# Patient Record
Sex: Female | Born: 2000 | Race: White | Hispanic: No | Marital: Single | Smoking: Never smoker
Health system: Southern US, Community
[De-identification: ages and names within clinical notes are randomized; demographics above are authoritative.]

## PROBLEM LIST (undated history)

## (undated) DIAGNOSIS — F32A Depression, unspecified: Secondary | ICD-10-CM

## (undated) DIAGNOSIS — F419 Anxiety disorder, unspecified: Secondary | ICD-10-CM

## (undated) HISTORY — PX: TONSILLECTOMY: SUR1361

## (undated) HISTORY — DX: Depression, unspecified: F32.A

## (undated) HISTORY — DX: Anxiety disorder, unspecified: F41.9

---
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
## 2021-05-17 ENCOUNTER — Other Ambulatory Visit: Payer: Self-pay

## 2021-05-17 DIAGNOSIS — W01198A Fall on same level from slipping, tripping and stumbling with subsequent striking against other object, initial encounter: Secondary | ICD-10-CM | POA: Diagnosis not present

## 2021-05-17 DIAGNOSIS — S0990XA Unspecified injury of head, initial encounter: Secondary | ICD-10-CM | POA: Diagnosis present

## 2021-05-17 DIAGNOSIS — S0181XA Laceration without foreign body of other part of head, initial encounter: Secondary | ICD-10-CM | POA: Diagnosis not present

## 2021-05-17 DIAGNOSIS — R55 Syncope and collapse: Secondary | ICD-10-CM | POA: Insufficient documentation

## 2021-05-17 LAB — CBC
HCT: 40.9 % (ref 36.0–46.0)
Hemoglobin: 13.6 g/dL (ref 12.0–15.0)
MCH: 28.3 pg (ref 26.0–34.0)
MCHC: 33.3 g/dL (ref 30.0–36.0)
MCV: 85 fL (ref 80.0–100.0)
Platelets: 220 10*3/uL (ref 150–400)
RBC: 4.81 MIL/uL (ref 3.87–5.11)
RDW: 12.3 % (ref 11.5–15.5)
WBC: 7.3 10*3/uL (ref 4.0–10.5)
nRBC: 0 % (ref 0.0–0.2)

## 2021-05-17 NOTE — ED Triage Notes (Signed)
Pt presents to ER via ems from school.  Pt states the last thing she can remember, she was in her bed, and her friends reportedly heard a crash and found pt lying in floor.  Pt states when she came around, she was sitting in a chair with ems crew and her friends around her.  Pt has laceration noted underneath chin.  Lac is appx 1.5 cm with bleeding controlled.  Pt denies hx of blacking out in past.  Pt currently A&O x4 in NAD.   ?

## 2021-05-18 ENCOUNTER — Emergency Department: Payer: BC Managed Care – PPO

## 2021-05-18 ENCOUNTER — Emergency Department
Admission: EM | Admit: 2021-05-18 | Discharge: 2021-05-18 | Disposition: A | Discharge status: Home / Self Care | Payer: BC Managed Care – PPO | Attending: Emergency Medicine | Admitting: Emergency Medicine

## 2021-05-18 ENCOUNTER — Ambulatory Visit: Payer: Self-pay | Admitting: *Deleted

## 2021-05-18 DIAGNOSIS — R55 Syncope and collapse: Secondary | ICD-10-CM

## 2021-05-18 DIAGNOSIS — S0181XA Laceration without foreign body of other part of head, initial encounter: Secondary | ICD-10-CM

## 2021-05-18 DIAGNOSIS — W19XXXA Unspecified fall, initial encounter: Secondary | ICD-10-CM

## 2021-05-18 LAB — BASIC METABOLIC PANEL
Anion gap: 8 (ref 5–15)
BUN: 14 mg/dL (ref 6–20)
CO2: 25 mmol/L (ref 22–32)
Calcium: 9.3 mg/dL (ref 8.9–10.3)
Chloride: 104 mmol/L (ref 98–111)
Creatinine, Ser: 0.6 mg/dL (ref 0.44–1.00)
GFR, Estimated: >60 mL/min (ref >60)
Glucose, Bld: 112 mg/dL — ABNORMAL HIGH (ref 70–99)
Potassium: 4.2 mmol/L (ref 3.5–5.1)
Sodium: 137 mmol/L (ref 135–145)

## 2021-05-18 LAB — URINALYSIS, ROUTINE W REFLEX MICROSCOPIC
Bilirubin Urine: NEGATIVE
Glucose, UA: NEGATIVE mg/dL
Hgb urine dipstick: NEGATIVE
Ketones, ur: NEGATIVE mg/dL
Nitrite: NEGATIVE
Protein, ur: NEGATIVE mg/dL
Specific Gravity, Urine: 1.004 — ABNORMAL LOW (ref 1.005–1.030)
pH: 6 (ref 5.0–8.0)

## 2021-05-18 MED ORDER — BACITRACIN ZINC 500 UNIT/GM EX OINT
TOPICAL_OINTMENT | Freq: Once | CUTANEOUS | Status: AC
  Filled 2021-05-18: qty 0.9

## 2021-05-18 MED ORDER — LIDOCAINE HCL (PF) 1 % IJ SOLN
5.0000 mL | Freq: Once | INTRAMUSCULAR | Status: AC
  Administered 2021-05-18: 03:00:00 5 mL
  Filled 2021-05-18: qty 5

## 2021-05-18 NOTE — ED Provider Notes (Signed)
? ?[facility] ?Provider Note ? ? ? Event Date/Time  ? First MD Initiated Contact with Patient 05/18/21 0111   ?  (approximate) ? ? ?History  ? ?Fall and Loss of Consciousness ? ? ?HPI ? ?Ruth Smith is a 21 y.o. female who presents to the ED for evaluation of Fall and Loss of Consciousness ?  ?No relevant medical history.  No OCPs or estrogen use. ? ?Patient presents to the ED after a syncopal episode today, causing a chin laceration.  She reports that she was seated on the couch for couple hours doing work on her laptop, when she got up to let someone in the back door when she suddenly collapsed to the ground and struck her chin.  She reports of syncope with minimal prodrome.  Reports awakening with a headache, which is slowly improving.  Denies chest pain, shortness of breath or any further syncopal episodes.  Denies pain to the extremities, chest, back or abdomen. ? ?She is a Press photographer, studying psychology and criminal justice.  She is here with her boyfriend. ? ?Physical Exam  ? ?Triage Vital Signs: ?ED Triage Vitals  ?Enc Vitals Group  ?   BP 05/17/21 2334 (!) 133/91  ?   Pulse Rate 05/17/21 2334 83  ?   Resp 05/17/21 2334 17  ?   Temp 05/17/21 2334 98.6 ?F (37 ?C)  ?   Temp Source 05/17/21 2334 Oral  ?   SpO2 05/17/21 2334 100 %  ?   Weight 05/17/21 2330 102 lb (46.3 kg)  ?   Height 05/17/21 2330 5\' 4"  (1.626 m)  ?   Head Circumference --   ?   Peak Flow --   ?   Pain Score 05/17/21 2329 6  ?   Pain Loc --   ?   Pain Edu? --   ?   Excl. in [place]? --   ? ? ?Most recent vital signs: ?Vitals:  ? 05/17/21 2334 05/18/21 0156  ?BP: (!) 133/91 124/83  ?Pulse: 83 85  ?Resp: 17 16  ?Temp: 98.6 ?F (37 ?C)   ?SpO2: 100% 100%  ? ? ?General: Awake, no distress.  Pleasant and conversational in full sentences.  Ambulatory with normal gait. ?CV:  Good peripheral perfusion. RRR ?Resp:  Normal effort.  ?Abd:  No distention.  Soft and benign ?MSK:  No deformity noted.  ?Neuro:  No focal deficits  appreciated. Cranial nerves II through XII intact ?5/5 strength and sensation in all 4 extremities ?Other:  Isolated horizontal laceration to the chin at the midline, about 2 cm in length.  Into the subcutaneous tissue.  Hemostatic with direct pressure.  No associated bony step-offs, no intraoral trauma. ? ? ?ED Results / Procedures / Treatments  ? ?Labs ?(all labs ordered are listed, but only abnormal results are displayed) ?Labs Reviewed  ?BASIC METABOLIC PANEL - Abnormal; Notable for the following components:  ?    Result Value  ? Glucose, Bld 112 (*)   ? All other components within normal limits  ?URINALYSIS, ROUTINE W REFLEX MICROSCOPIC - Abnormal; Notable for the following components:  ? Color, Urine STRAW (*)   ? APPearance CLEAR (*)   ? Specific Gravity, Urine 1.004 (*)   ? Leukocytes,Ua TRACE (*)   ? Bacteria, UA RARE (*)   ? All other components within normal limits  ?CBC  ?CBG MONITORING, ED  ?POC URINE PREG, ED  ? ? ?EKG ?Sinus rhythm, rate of 90 bpm.  Normal axis and intervals.  No evidence of acute ischemia. ? ?RADIOLOGY ?CT of the head reviewed by me without evidence of acute intracranial pathology.  No mandibular fractures noted. ? ?Official radiology report(s): ?CT HEAD WO CONTRAST (5MM) ? ?Result Date: 05/18/2021 ?CLINICAL DATA:  Recent fall with headaches, initial encounter EXAM: CT HEAD WITHOUT CONTRAST TECHNIQUE: Contiguous axial images were obtained from the base of the skull through the vertex without intravenous contrast. RADIATION DOSE REDUCTION: This exam was performed according to the departmental dose-optimization program which includes automated exposure control, adjustment of the mA and/or kV according to patient size and/or use of iterative reconstruction technique. COMPARISON:  None. FINDINGS: Brain: No evidence of acute infarction, hemorrhage, hydrocephalus, extra-axial collection or mass lesion/mass effect. Vascular: No hyperdense vessel or unexpected calcification. Skull: Normal.  Negative for fracture or focal lesion. Sinuses/Orbits: No acute finding. Other: None. IMPRESSION: No acute intracranial abnormality noted. Electronically Signed   By: Inez Catalina M.D.   On: 05/18/2021 02:59   ? ?PROCEDURES and INTERVENTIONS: ? ?Marland Kitchen.Laceration Repair ? ?Date/Time: 05/18/2021 3:31 AM ?Performed by: Vladimir Crofts, MD ?Authorized by: Vladimir Crofts, MD  ? ?Consent:  ?  Consent obtained:  Verbal ?  Consent given by:  Patient ?  Risks, benefits, and alternatives were discussed: yes   ?Anesthesia:  ?  Anesthesia method:  Local infiltration ?  Local anesthetic:  Lidocaine 1% w/o epi ?Laceration details:  ?  Location:  Face ?  Face location:  Chin ?  Length (cm):  2 ?Pre-procedure details:  ?  Preparation:  Patient was prepped and draped in usual sterile fashion ?Exploration:  ?  Hemostasis achieved with:  Direct pressure ?  Imaging obtained comment:  Ct ?  Imaging outcome: foreign body not noted   ?Treatment:  ?  Area cleansed with:  Povidone-iodine ?  Amount of cleaning:  Standard ?  Irrigation solution:  Sterile water ?  Irrigation method:  Syringe ?  Visualized foreign bodies/material removed: no   ?  Debridement:  None ?  Undermining:  None ?  Scar revision: no   ?Skin repair:  ?  Repair method:  Sutures ?  Suture size:  5-0 ?  Wound skin closure material used: monocryl. ?  Suture technique:  Simple interrupted ?  Number of sutures:  3 ?Approximation:  ?  Approximation:  Close ?Repair type:  ?  Repair type:  Simple ?Post-procedure details:  ?  Dressing:  Antibiotic ointment ?  Procedure completion:  Tolerated well, no immediate complications ? ?Medications  ?bacitracin ointment (has no administration in time range)  ?lidocaine (PF) (XYLOCAINE) 1 % injection 5 mL (5 mLs Infiltration Given 05/18/21 0324)  ? ? ? ?IMPRESSION / MDM / ASSESSMENT AND PLAN / ED COURSE  ?I reviewed the triage vital signs and the nursing notes. ? ?Healthy and low risk 21 year old girl presents to the ED with a chin laceration after a  syncopal episode, ultimately suitable for outpatient management.  She presents with normal vital signs and reassuring examination.  No signs of neurologic or vascular deficits.  No distress.  Does have an isolated laceration to the chin, that was repaired as above.  Blood work is benign with a normal metabolic panel and CBC, urine without infectious features.  EKG is normal without ischemic features or interval changes to suggest cardiac etiology of syncope.  CT scan of the head without evidence of ICH or mandibular fracture.  Uncertain etiology of her syncopal episode, possibly vasovagal in setting of standing up rapidly  after being seated for a while.  Less likely cardiogenic.  PERC negative and less likely PE.  I considered observation admission for further telemetry monitoring for this patient, but shared decision making yields plan to pursue outpatient management.  We discussed close return precautions and she is suitable for outpatient management. ? ?Clinical Course as of 05/18/21 0332  ?Thu May 18, 2021  ?0327 3 sutures placed, well-tolerated. [DS]  ?  ?Clinical Course User Index ?[DS] Vladimir Crofts, MD  ? ? ? ?FINAL CLINICAL IMPRESSION(S) / ED DIAGNOSES  ? ?Final diagnoses:  ?Fall, initial encounter  ?Syncope and collapse  ?Chin laceration, initial encounter  ? ? ? ?Rx / DC Orders  ? ?ED Discharge Orders   ? ? None  ? ?  ? ? ? ?Note:  This document was prepared using Dragon voice recognition software and may include unintentional dictation errors. ?  ?Vladimir Crofts, MD ?05/18/21 [phone] ? ?

## 2021-05-18 NOTE — Discharge Instructions (Signed)
Please take Tylenol and ibuprofen/Advil for your pain.  It is safe to take them together, or to alternate them every few hours.  Take up to 1000mg  of Tylenol at a time, up to 4 times per day.  Do not take more than 4000 mg of Tylenol in 24 hours.  For ibuprofen, take 400-600 mg, 4-5 times per day. ? ?The 3 stitches should absorb on their own in the next 7-10 days.  ? ?Keep the area clean and dry, then apply some sort of ointment like Neosporin or even just Vaseline.  This will help with healing, preventing infection and reducing scar tissue. ? ?If you develop any fevers, pus coming from this wound or any additional episodes of just randomly passing out then please return to the ED. ?

## 2021-05-18 NOTE — Telephone Encounter (Addendum)
?  Chief Complaint: wound care ?Symptoms: none ?Frequency: na ?Pertinent Negatives: Patient denies problem with wound itself ?Disposition: [] ED /[] Urgent Care (no appt availability in office) / [] Appointment(In office/virtual)/ []  [facility]/ [] Home Care/ [] Refused Recommended Disposition /[] [facility]/ [x]  Follow-up with PCP ?Additional Notes: Pt states when leaving ED she got after visit summary that said keep area dry and apply neosporin on it. She states another doctor said to not do anything to it today, start washing with soap and water and applying neosporin tomorrow. Just wondering which to do. Advised that it is clean today, start washing, drying, and apply neosporin tomorrow. ? ?Reason for Disposition ? Health Information question, no triage required and triager able to answer question ? ?Answer Assessment - Initial Assessment Questions ?1. REASON FOR CALL or QUESTION: "What is your reason for calling today?" or "How can I best help you?" or "What question do you have that I can help answer?" ?Pt states two doctors in ED today gave her different instructions. Wondering what to do. ? ?Protocols used: Information Only Call - No Triage-A-AH ? ?

## 2021-06-14 DIAGNOSIS — F419 Anxiety disorder, unspecified: Secondary | ICD-10-CM | POA: Insufficient documentation

## 2021-06-14 DIAGNOSIS — M41129 Adolescent idiopathic scoliosis, site unspecified: Secondary | ICD-10-CM | POA: Insufficient documentation

## 2021-09-21 DIAGNOSIS — K5909 Other constipation: Secondary | ICD-10-CM | POA: Insufficient documentation

## 2021-10-27 ENCOUNTER — Ambulatory Visit (INDEPENDENT_AMBULATORY_CARE_PROVIDER_SITE_OTHER): Payer: BC Managed Care – PPO

## 2021-10-27 ENCOUNTER — Ambulatory Visit
Admission: EM | Admit: 2021-10-27 | Discharge: 2021-10-27 | Disposition: A | Discharge status: Home / Self Care | Payer: BC Managed Care – PPO | Attending: Family Medicine | Admitting: Family Medicine

## 2021-10-27 ENCOUNTER — Other Ambulatory Visit: Payer: BC Managed Care – PPO

## 2021-10-27 ENCOUNTER — Encounter: Payer: Self-pay | Admitting: Emergency Medicine

## 2021-10-27 DIAGNOSIS — S99912A Unspecified injury of left ankle, initial encounter: Secondary | ICD-10-CM

## 2021-10-27 DIAGNOSIS — M79672 Pain in left foot: Secondary | ICD-10-CM

## 2021-10-27 NOTE — ED Provider Notes (Signed)
Ruth Smith    CSN: [id number] Arrival date & time: 10/27/21  1737      History   Chief Complaint Chief Complaint  Patient presents with   Ankle Pain    Entered by patient    HPI Ruth Smith is a 21 y.o. female.   HPI Patient presents for evaluation of left ankle pain and left foot pain sustained following rolling of ankle and hitting lateral aspect of foot on left side of concrete barrier. Patient reports injury occurred less than 2 hours ago. She has not taken any medication or iced injury. She endorses pain with full weight bearing at the base of her ankle extending lateral aspect of foot. She denies impaired sensation, numbness or tingling of foot.  History reviewed. No pertinent past medical history.  There are no problems to display for this patient.   History reviewed. No pertinent surgical history.  OB History   No obstetric history on file.      Home Medications    Prior to Admission medications   Not on File    Family History History reviewed. No pertinent family history.  Social History Social History   Tobacco Use   Smoking status: Never   Smokeless tobacco: Never  Substance Use Topics   Alcohol use: Yes    Comment: occasional   Drug use: Never     Allergies   Patient has no known allergies.   Review of Systems Review of Systems Pertinent negatives listed in HPI   Physical Exam Triage Vital Signs ED Triage Vitals  Enc Vitals Group     BP      Pulse      Resp      Temp      Temp src      SpO2      Weight      Height      Head Circumference      Peak Flow      Pain Score      Pain Loc      Pain Edu?      Excl. in [place]?    No data found.  Updated Vital Signs BP 131/88 (BP Location: Right Arm)   Pulse 89   Temp 99 F (37.2 C) (Oral)   Resp 16   SpO2 96%   Visual Acuity Right Eye Distance:   Left Eye Distance:   Bilateral Distance:    Right Eye Near:   Left Eye Near:    Bilateral Near:     Physical  Exam Constitutional:      Appearance: Normal appearance.  Cardiovascular:     Rate and Rhythm: Normal rate and regular rhythm.  Pulmonary:     Effort: Pulmonary effort is normal.     Breath sounds: Normal breath sounds.  Musculoskeletal:     Right ankle: Normal.     Left ankle: Swelling present. Tenderness present over the lateral malleolus and base of 5th metatarsal. Decreased range of motion.     Left Achilles Tendon: Normal.  Skin:    General: Skin is warm and dry.  Neurological:     General: No focal deficit present.     Mental Status: She is alert.  Psychiatric:        Mood and Affect: Mood normal.        Behavior: Behavior normal.    UC Treatments / Results  Labs (all labs ordered are listed, but only abnormal results are displayed) Labs Reviewed - No  data to display  EKG   Radiology DG Foot Complete Left  Result Date: 10/27/2021 CLINICAL DATA:  Trauma, pain EXAM: LEFT FOOT - COMPLETE 3+ VIEW COMPARISON:  None Available. FINDINGS: There is no evidence of fracture or dislocation. There is no evidence of arthropathy or other focal bone abnormality. Soft tissues are unremarkable. IMPRESSION: No fracture or dislocation is seen. Electronically Signed   By: Ernie Avena M.D.   On: 10/27/2021 18:16   DG Ankle Complete Left  Result Date: 10/27/2021 CLINICAL DATA:  Twisted left ankle, pain EXAM: LEFT ANKLE COMPLETE - 3+ VIEW COMPARISON:  None Available. FINDINGS: There is no evidence of fracture, dislocation, or joint effusion. There is no evidence of arthropathy or other focal bone abnormality. Soft tissues are unremarkable. IMPRESSION: No fracture or dislocation of the left ankle. Electronically Signed   By: Jearld Lesch M.D.   On: 10/27/2021 18:15    Procedures Procedures (including critical care time)  Medications Ordered in UC Medications - No data to display  Initial Impression / Assessment and Plan / UC Course  I have reviewed the triage vital signs and the  nursing notes.  Pertinent labs & imaging results that were available during my care of the patient were reviewed by me and considered in my medical decision making (see chart for details).    Imaging of the Left foot and Left ankle negative. Recommend RICE. ACE wrap applied to midfoot and ankle (left). If symptoms worsen or have not significantly improved within  7 days, follow-up with [facility]. Final Clinical Impressions(s) / UC Diagnoses   Final diagnoses:  Left ankle injury, initial encounter  Pain of left foot     Discharge Instructions      Imaging negative for acute fracture ankle and or foot. Ibuprofen 600 mg 3 times daily for acute pain  Recommend, compressive ACE bandage. Rest and elevate the affected painful area.  Apply cold compresses intermittently as needed.  As pain recedes, begin normal activities slowly as tolerated.  If pain persists, follow-up at [facility]      ED Prescriptions   None    PDMP not reviewed this encounter.   Bing Neighbors, FNP 10/27/21 [phone]

## 2021-10-27 NOTE — Discharge Instructions (Addendum)
Imaging negative for acute fracture ankle and or foot. Ibuprofen 600 mg 3 times daily for acute pain  Recommend, compressive ACE bandage. Rest and elevate the affected painful area.  Apply cold compresses intermittently as needed.  As pain recedes, begin normal activities slowly as tolerated.  If pain persists, follow-up at [facility]

## 2021-10-27 NOTE — ED Triage Notes (Signed)
Stepped on parking bumper/stone object, rolled left ankle this afternoon. Experiencing pain to lateral aspect of base of left ankle/foot. Pain worsens with weight bearing, antalgic gait present. Mild swelling/bruising visible, tender to touch.

## 2021-12-08 ENCOUNTER — Ambulatory Visit
Admission: RE | Admit: 2021-12-08 | Discharge: 2021-12-08 | Disposition: A | Discharge status: Home / Self Care | Payer: BC Managed Care – PPO | Source: Ambulatory Visit

## 2021-12-08 VITALS — BP 116/82 | HR 74 | Temp 97.9°F | Resp 15

## 2021-12-08 DIAGNOSIS — J22 Unspecified acute lower respiratory infection: Secondary | ICD-10-CM | POA: Diagnosis not present

## 2021-12-08 DIAGNOSIS — R051 Acute cough: Secondary | ICD-10-CM | POA: Diagnosis not present

## 2021-12-08 MED ORDER — AMOXICILLIN-POT CLAVULANATE 875-125 MG PO TABS: 1.0000 | ORAL_TABLET | Freq: Two times a day (BID) | ORAL | 0 refills | Status: DC

## 2021-12-08 MED ORDER — PREDNISONE 10 MG PO TABS: ORAL_TABLET | ORAL | 0 refills | Status: DC

## 2021-12-08 NOTE — Discharge Instructions (Addendum)
Follow up here or with your primary care provider if your symptoms are not improving with treatment.     

## 2021-12-08 NOTE — ED Provider Notes (Signed)
Roderic Palau    CSN: [id number] Arrival date & time: 12/08/21  [id number]      History   Chief Complaint Chief Complaint  Patient presents with   Cough    Entered by patient    HPI Ruth Smith is a 21 y.o. female.    Cough   Presents to urgent care with complaint of cough since 1+ weeks. Reports associated symptoms of thoracic and lumbar back pain. Back pain is worse with cough. Also nasal congestion is present, previously with sinus pressure and ears popping, now resolved. Previously sore throat, now resolved.  Denies fever. Denies GI symptoms - no n/v/d.  Taking OTC medication without relief.  No past medical history on file.  There are no problems to display for this patient.   No past surgical history on file.  OB History   No obstetric history on file.      Home Medications    Prior to Admission medications   Not on File    Family History No family history on file.  Social History Social History   Tobacco Use   Smoking status: Never   Smokeless tobacco: Never  Substance Use Topics   Alcohol use: Yes    Comment: occasional   Drug use: Never     Allergies   Patient has no known allergies.   Review of Systems Review of Systems  Respiratory:  Positive for cough.      Physical Exam Triage Vital Signs ED Triage Vitals  Enc Vitals Group     BP      Pulse      Resp      Temp      Temp src      SpO2      Weight      Height      Head Circumference      Peak Flow      Pain Score      Pain Loc      Pain Edu?      Excl. in [place]?    No data found.  Updated Vital Signs There were no vitals taken for this visit.  Visual Acuity Right Eye Distance:   Left Eye Distance:   Bilateral Distance:    Right Eye Near:   Left Eye Near:    Bilateral Near:     Physical Exam Vitals reviewed.  Constitutional:      Appearance: Normal appearance.  HENT:     Head: Normocephalic and atraumatic.     Right Ear: Tympanic membrane  normal.     Left Ear: Tympanic membrane normal.     Nose: Congestion present.     Mouth/Throat:     Mouth: Mucous membranes are moist.     Pharynx: Oropharynx is clear. Posterior oropharyngeal erythema present. No oropharyngeal exudate.  Eyes:     Conjunctiva/sclera: Conjunctivae normal.     Pupils: Pupils are equal, round, and reactive to light.  Cardiovascular:     Rate and Rhythm: Normal rate.     Pulses: Normal pulses.     Heart sounds: Normal heart sounds.  Pulmonary:     Breath sounds: Normal breath sounds.  Musculoskeletal:     Cervical back: Normal range of motion and neck supple.  Skin:    General: Skin is warm and dry.  Neurological:     General: No focal deficit present.     Mental Status: She is alert and oriented to person, place, and time.  Psychiatric:  Mood and Affect: Mood normal.        Behavior: Behavior normal.      UC Treatments / Results  Labs (all labs ordered are listed, but only abnormal results are displayed) Labs Reviewed - No data to display  EKG   Radiology No results found.  Procedures Procedures (including critical care time)  Medications Ordered in UC Medications - No data to display  Initial Impression / Assessment and Plan / UC Course  I have reviewed the triage vital signs and the nursing notes.  Pertinent labs & imaging results that were available during my care of the patient were reviewed by me and considered in my medical decision making (see chart for details).   Some crackles in the upper right lobe.  Given this along with duration of symptoms, will treat for acute bacterial infection with Augmentin.  Prescribing a short taper of prednisone for cough.   Final Clinical Impressions(s) / UC Diagnoses   Final diagnoses:  None   Discharge Instructions   None    ED Prescriptions   None    PDMP not reviewed this encounter.   Rose Phi, [place] 12/08/21 [phone]

## 2021-12-08 NOTE — ED Triage Notes (Signed)
Pt. Is c/o a constant cough and back pain for the last week. Pt. Has been taking OTC medication w/ no relief.

## 2021-12-20 ENCOUNTER — Ambulatory Visit
Admission: EM | Admit: 2021-12-20 | Discharge: 2021-12-20 | Disposition: A | Discharge status: Home / Self Care | Payer: BC Managed Care – PPO | Attending: Emergency Medicine | Admitting: Emergency Medicine

## 2021-12-20 DIAGNOSIS — M546 Pain in thoracic spine: Secondary | ICD-10-CM | POA: Diagnosis not present

## 2021-12-20 MED ORDER — IBUPROFEN 600 MG PO TABS: 600.0000 mg | ORAL_TABLET | Freq: Four times a day (QID) | ORAL | 0 refills | Status: AC | PRN

## 2021-12-20 MED ORDER — CYCLOBENZAPRINE HCL 5 MG PO TABS: 5.0000 mg | ORAL_TABLET | Freq: Three times a day (TID) | ORAL | 0 refills | Status: DC | PRN

## 2021-12-20 NOTE — Discharge Instructions (Addendum)
Take the ibuprofen as needed for discomfort.  Take the muscle relaxer as needed for muscle spasm; Do not drive, operate machinery, or drink alcohol with this medication as it can cause drowsiness.   Follow up with your primary care provider or an orthopedist if your symptoms are not improving.     

## 2021-12-20 NOTE — ED Provider Notes (Signed)
Roderic Palau    CSN: [id number] Arrival date & time: 12/20/21  0809      History   Chief Complaint Chief Complaint  Patient presents with   Back Pain    HPI Ruth Smith is a 21 y.o. female.  Patient presents with left thoracic back pain x2 days.  No falls or injury.  The pain started when she was lying in bed, watching a movie on her computer.  She describes the pain as sharp and tingling.  It is nonradiating; worse with deep breathing and improves with sitting up straight.  No associated symptoms.  No fever, chills, cough, shortness of breath, abdominal pain, dysuria, hematuria, numbness, weakness, paresthesias, or other symptoms.  She has a history of scoliosis.  No OTC medications taken today.  She denies current pregnancy or breastfeeding.     The history is provided by the patient and medical records.    Past Medical History:  Diagnosis Date   Anxiety    Depression     Patient Active Problem List   Diagnosis Date Noted   Chronic constipation 09/21/2021   Adolescent idiopathic scoliosis, site unspecified 06/14/2021   Anxiety 06/14/2021    Past Surgical History:  Procedure Laterality Date   TONSILLECTOMY      OB History   No obstetric history on file.      Home Medications    Prior to Admission medications   Medication Sig Start Date End Date Taking? Authorizing Provider  cyclobenzaprine (FLEXERIL) 5 MG tablet Take 1 tablet (5 mg total) by mouth 3 (three) times daily as needed for muscle spasms. 12/20/21  Yes Sharion Balloon, NP  ibuprofen (ADVIL) 600 MG tablet Take 1 tablet (600 mg total) by mouth every 6 (six) hours as needed. 12/20/21  Yes Sharion Balloon, NP  amoxicillin-clavulanate (AUGMENTIN) 875-125 MG tablet Take 1 tablet by mouth every 12 (twelve) hours. 12/08/21   Immordino, Annie Main, FNP  LINZESS 145 MCG CAPS capsule Take 145 mcg by mouth daily. 11/24/21   [provider]  nitrofurantoin, macrocrystal-monohydrate, (MACROBID) 100 MG  capsule Take 100 mg by mouth 2 (two) times daily. 10/05/21   [provider]  ofloxacin (OCUFLOX) 0.3 % ophthalmic solution SMARTSIG:1-2 Drop(s) In Eye(s) 08/27/21   [provider]  predniSONE (DELTASONE) 10 MG tablet Take 4 tablets (40 mg) x 2 days, then 2 tablets (20 mg) x 1 day, then 1 tablet (10 mg) x 1 day. 12/08/21   Immordino, Annie Main, FNP  sertraline (ZOLOFT) 100 MG tablet  02/09/20   [provider]    Family History History reviewed. No pertinent family history.  Social History Social History   Tobacco Use   Smoking status: Never   Smokeless tobacco: Never  Substance Use Topics   Alcohol use: Yes    Comment: occasional   Drug use: Never     Allergies   Patient has no known allergies.   Review of Systems Review of Systems  Constitutional:  Negative for chills and fever.  Respiratory:  Negative for cough and shortness of breath.   Cardiovascular:  Negative for chest pain and palpitations.  Gastrointestinal:  Negative for abdominal pain and vomiting.  Genitourinary:  Negative for dysuria and hematuria.  Musculoskeletal:  Positive for back pain. Negative for gait problem.  Skin:  Negative for color change, rash and wound.  Neurological:  Negative for weakness and numbness.  All other systems reviewed and are negative.    Physical Exam Triage Vital Signs ED Triage  Vitals  Enc Vitals Group     BP      Pulse      Resp      Temp      Temp src      SpO2      Weight      Height      Head Circumference      Peak Flow      Pain Score      Pain Loc      Pain Edu?      Excl. in [place]?    No data found.  Updated Vital Signs BP 114/81   Pulse 72   Temp 98.1 F (36.7 C)   Resp 18   Ht 5\' 5"  (1.651 m)   Wt 102 lb (46.3 kg)   LMP 11/14/2021 (Exact Date)   SpO2 98%   BMI 16.97 kg/m   Visual Acuity Right Eye Distance:   Left Eye Distance:   Bilateral Distance:    Right Eye Near:   Left Eye Near:    Bilateral Near:      Physical Exam Vitals and nursing note reviewed.  Constitutional:      General: She is not in acute distress.    Appearance: Normal appearance. She is well-developed. She is not ill-appearing.  HENT:     Mouth/Throat:     Mouth: Mucous membranes are moist.  Cardiovascular:     Rate and Rhythm: Normal rate and regular rhythm.     Heart sounds: Normal heart sounds.  Pulmonary:     Effort: Pulmonary effort is normal. No respiratory distress.     Breath sounds: Normal breath sounds.  Abdominal:     General: Bowel sounds are normal.     Palpations: Abdomen is soft.     Tenderness: There is no abdominal tenderness. There is no right CVA tenderness, left CVA tenderness, guarding or rebound.  Musculoskeletal:        General: No swelling, tenderness, deformity or signs of injury. Normal range of motion.     Cervical back: Neck supple.  Skin:    General: Skin is warm and dry.     Capillary Refill: Capillary refill takes less than 2 seconds.     Findings: No bruising, erythema, lesion or rash.  Neurological:     General: No focal deficit present.     Mental Status: She is alert and oriented to person, place, and time.     Sensory: No sensory deficit.     Motor: No weakness.     Gait: Gait normal.  Psychiatric:        Mood and Affect: Mood normal.        Behavior: Behavior normal.      UC Treatments / Results  Labs (all labs ordered are listed, but only abnormal results are displayed) Labs Reviewed - No data to display  EKG   Radiology No results found.  Procedures Procedures (including critical care time)  Medications Ordered in UC Medications - No data to display  Initial Impression / Assessment and Plan / UC Course  I have reviewed the triage vital signs and the nursing notes.  Pertinent labs & imaging results that were available during my care of the patient were reviewed by me and considered in my medical decision making (see chart for details).    Acute left  thoracic back pain.  Patient declines urine check as she is not having any urine symptoms.  Treating today with ibuprofen and Flexeril.  Precautions for drowsiness with Flexeril discussed.  Instructed patient to follow up with her PCP or an orthopedist if her symptoms are not improving.  Contact information for on-call Ortho provided.  She agrees to plan of care.    Final Clinical Impressions(s) / UC Diagnoses   Final diagnoses:  Acute left-sided thoracic back pain     Discharge Instructions      Take the ibuprofen as needed for discomfort.  Take the muscle relaxer as needed for muscle spasm; Do not drive, operate machinery, or drink alcohol with this medication as it can cause drowsiness.   Follow up with your primary care provider or an orthopedist if your symptoms are not improving.         ED Prescriptions     Medication Sig Dispense Auth. Provider   ibuprofen (ADVIL) 600 MG tablet Take 1 tablet (600 mg total) by mouth every 6 (six) hours as needed. 30 tablet Mickie Bail, NP   cyclobenzaprine (FLEXERIL) 5 MG tablet Take 1 tablet (5 mg total) by mouth 3 (three) times daily as needed for muscle spasms. 15 tablet Mickie Bail, NP      PDMP not reviewed this encounter.   Mickie Bail, NP 12/20/21 [phone]

## 2021-12-20 NOTE — ED Triage Notes (Addendum)
Patient to Urgent Care with complaints of mid-thoracic back pain x2 days. Reports she first noticed the pain when she was laying down in bed. Describes pain as sharp and tingling, worsens with deep breathing and only present when lying down.   Reports nausea last night. Denies any known injury. Reports recently being treated for a lower respiratory infection- no longer symptomatic.

## 2022-02-12 ENCOUNTER — Ambulatory Visit (INDEPENDENT_AMBULATORY_CARE_PROVIDER_SITE_OTHER): Payer: BC Managed Care – PPO | Admitting: Oncology

## 2022-02-12 ENCOUNTER — Encounter: Payer: Self-pay | Admitting: Oncology

## 2022-02-12 VITALS — BP 118/60 | HR 69 | Temp 97.4°F | Resp 18 | Ht 65.5 in | Wt 111.0 lb

## 2022-02-12 DIAGNOSIS — R051 Acute cough: Secondary | ICD-10-CM

## 2022-02-12 MED ORDER — AZITHROMYCIN 250 MG PO TABS: ORAL_TABLET | ORAL | 0 refills | Status: AC

## 2022-02-12 MED ORDER — PREDNISONE 20 MG PO TABS: 40.0000 mg | ORAL_TABLET | Freq: Every day | ORAL | 0 refills | Status: AC

## 2022-02-12 NOTE — Progress Notes (Signed)
[facility] [address] [place], [place] [address] Phone: [phone] Fax: [phone]   Office Visit Note  Patient Name: Ruth Smith  Date of Birth:2000/08/13  Med Rec number [id number]  Date of Service: 02/12/2022  Patient has no known allergies.  No chief complaint on file.  Patient is an 21 y.o. student here for complaints of cough X 4 days. Has not tried anything otc. Had some congestion initially which has resolved. No asthma history. Cough is worse in the morning. Some wheezing.  Occasionally coughs up some mucous but otherwise describes cough as dry. No sick contacts. No fevers. Leaving to go home on Friday.   No allergies to medications.   Current Medication:  Outpatient Encounter Medications as of 02/12/2022  Medication Sig   amoxicillin-clavulanate (AUGMENTIN) 875-125 MG tablet Take 1 tablet by mouth every 12 (twelve) hours.   cyclobenzaprine (FLEXERIL) 5 MG tablet Take 1 tablet (5 mg total) by mouth 3 (three) times daily as needed for muscle spasms.   ibuprofen (ADVIL) 600 MG tablet Take 1 tablet (600 mg total) by mouth every 6 (six) hours as needed.   LINZESS 145 MCG CAPS capsule Take 145 mcg by mouth daily.   nitrofurantoin, macrocrystal-monohydrate, (MACROBID) 100 MG capsule Take 100 mg by mouth 2 (two) times daily.   ofloxacin (OCUFLOX) 0.3 % ophthalmic solution SMARTSIG:1-2 Drop(s) In Eye(s)   predniSONE (DELTASONE) 10 MG tablet Take 4 tablets (40 mg) x 2 days, then 2 tablets (20 mg) x 1 day, then 1 tablet (10 mg) x 1 day.   sertraline (ZOLOFT) 100 MG tablet    No facility-administered encounter medications on file as of 02/12/2022.      Medical History: Past Medical History:  Diagnosis Date   Anxiety    Depression      Vital Signs: There were no vitals taken for this visit.  ROS: As per HPI.  All other pertinent ROS negative.     Review of Systems  Constitutional:  Positive for fatigue.  HENT:  Positive for congestion and postnasal drip.  Negative for sinus pressure, sinus pain and sore throat.   Respiratory:  Positive for cough.   Gastrointestinal:  Negative for diarrhea, nausea and vomiting.  Musculoskeletal:  Negative for myalgias.  Neurological:  Negative for dizziness and headaches.    Physical Exam Vitals reviewed.  Constitutional:      Appearance: Normal appearance.  HENT:     Right Ear: Tympanic membrane normal.     Left Ear: Tympanic membrane normal.     Nose: No congestion.     Right Turbinates: Not swollen.  Pulmonary:     Effort: Pulmonary effort is normal.     Breath sounds: Examination of the left-upper field reveals wheezing. Examination of the right-lower field reveals rhonchi. Examination of the left-lower field reveals wheezing. Wheezing and rhonchi present.  Lymphadenopathy:     Head:     Right side of head: No submandibular adenopathy.     Left side of head: No submandibular adenopathy.     Cervical: No cervical adenopathy.  Neurological:     Mental Status: She is alert.    No results found for this or any previous visit (from the past 24 hour(s)).  Assessment/Plan: 1. Acute cough -Exam concerning for bronchitis.  Recommend treating with azithromycin 2 tablets on day 1 followed by 1 tablet daily until complete and prednisone 40 mg each morning with breakfast X 3 days.  Take both with food.  Education provided in clinic  and via [place].  Please let me know if your symptoms worsen or fail to improve.  May take Tylenol or ibuprofen for discomfort.  - azithromycin (ZITHROMAX) 250 MG tablet; Take 2 tablets on day 1, then 1 tablet daily on days 2 through 5  Dispense: 6 tablet; Refill: 0 - predniSONE (DELTASONE) 20 MG tablet; Take 2 tablets (40 mg total) by mouth daily with breakfast.  Dispense: 6 tablet; Refill: 0   Disposition-return to clinic for worsening of symptoms or failure to improve.  General Counseling: Ruth Smith understanding of the findings of todays visit and agrees with plan of  treatment. I have discussed any further diagnostic evaluation that may be needed or ordered today. We also reviewed her medications today. she has been encouraged to call the office with any questions or concerns that should arise related to todays visit.   No orders of the defined types were placed in this encounter.   No orders of the defined types were placed in this encounter.  I spent 20 minutes dedicated to the care of this patient (face-to-face and non-face-to-face) on the date of the encounter to include what is described in the assessment and plan.   Faythe Casa, NP 02/12/2022 10:08 AM

## 2022-05-01 ENCOUNTER — Ambulatory Visit
Admission: RE | Admit: 2022-05-01 | Discharge: 2022-05-01 | Disposition: A | Discharge status: Home / Self Care | Payer: BC Managed Care – PPO | Source: Ambulatory Visit

## 2022-05-01 VITALS — BP 112/78 | HR 69 | Temp 98.1°F | Resp 18

## 2022-05-01 DIAGNOSIS — J069 Acute upper respiratory infection, unspecified: Secondary | ICD-10-CM

## 2022-05-01 DIAGNOSIS — R0982 Postnasal drip: Secondary | ICD-10-CM

## 2022-05-01 DIAGNOSIS — J029 Acute pharyngitis, unspecified: Secondary | ICD-10-CM

## 2022-05-01 LAB — POCT RAPID STREP A (OFFICE): Rapid Strep A Screen: NEGATIVE

## 2022-05-01 NOTE — ED Triage Notes (Signed)
Patient to Urgent Care with complaints of sore throat that started three days ago.   Reports she also has some nasal congestion. Denies any known fevers.

## 2022-05-01 NOTE — ED Provider Notes (Addendum)
Roderic Palau    CSN: [id number] Arrival date & time: 05/01/22  0802      History   Chief Complaint Chief Complaint  Patient presents with   Sore Throat    Entered by patient    HPI Ruth Smith is a 22 y.o. female.  Patient presents with sore throat x 3 days.  She reports nasal congestion, runny nose, postnasal drip also.  Treating with Zicam.  No fever, rash, cough, shortness of breath, vomiting, diarrhea, or other symptoms.    The history is provided by the patient and medical records.    Past Medical History:  Diagnosis Date   Anxiety    Depression     Patient Active Problem List   Diagnosis Date Noted   Chronic constipation 09/21/2021   Adolescent idiopathic scoliosis, site unspecified 06/14/2021   Anxiety 06/14/2021    Past Surgical History:  Procedure Laterality Date   TONSILLECTOMY      OB History   No obstetric history on file.      Home Medications    Prior to Admission medications   Medication Sig Start Date End Date Taking? Authorizing Provider  albuterol (VENTOLIN HFA) 108 (90 Base) MCG/ACT inhaler Inhale 2 puffs into the lungs every 4 (four) hours as needed. 02/20/22  Yes [provider]  ibuprofen (ADVIL) 600 MG tablet Take 1 tablet (600 mg total) by mouth every 6 (six) hours as needed. 12/20/21   Sharion Balloon, NP  LINZESS 145 MCG CAPS capsule Take 145 mcg by mouth daily. 11/24/21   [provider]  predniSONE (DELTASONE) 20 MG tablet Take 2 tablets (40 mg total) by mouth daily with breakfast. Patient not taking: Reported on 05/01/2022 02/12/22   Jacquelin Hawking, NP  sertraline (ZOLOFT) 100 MG tablet  02/09/20   [provider]    Family History No family history on file.  Social History Social History   Tobacco Use   Smoking status: Never   Smokeless tobacco: Never  Vaping Use   Vaping Use: Never used  Substance Use Topics   Alcohol use: Yes    Comment: occasional   Drug use: Never      Allergies   Patient has no known allergies.   Review of Systems Review of Systems  Constitutional:  Negative for chills and fever.  HENT:  Positive for congestion, postnasal drip, rhinorrhea and sore throat. Negative for ear pain.   Respiratory:  Negative for cough and shortness of breath.   Cardiovascular:  Negative for chest pain and palpitations.  Gastrointestinal:  Negative for diarrhea and vomiting.  Skin:  Negative for color change and rash.  All other systems reviewed and are negative.    Physical Exam Triage Vital Signs ED Triage Vitals  Enc Vitals Group     BP      Pulse      Resp      Temp      Temp src      SpO2      Weight      Height      Head Circumference      Peak Flow      Pain Score      Pain Loc      Pain Edu?      Excl. in [place]?    No data found.  Updated Vital Signs BP 112/78   Pulse 69   Temp 98.1 F (36.7 C)   Resp 18   LMP 04/12/2022  SpO2 98%   Visual Acuity Right Eye Distance:   Left Eye Distance:   Bilateral Distance:    Right Eye Near:   Left Eye Near:    Bilateral Near:     Physical Exam Vitals and nursing note reviewed.  Constitutional:      General: She is not in acute distress.    Appearance: Normal appearance. She is well-developed. She is not ill-appearing.  HENT:     Right Ear: Tympanic membrane normal.     Left Ear: Tympanic membrane normal.     Nose: Rhinorrhea present.     Mouth/Throat:     Mouth: Mucous membranes are moist.     Pharynx: Oropharynx is clear.     Comments: Clear PND. Cardiovascular:     Rate and Rhythm: Normal rate and regular rhythm.     Heart sounds: Normal heart sounds.  Pulmonary:     Effort: Pulmonary effort is normal. No respiratory distress.     Breath sounds: Normal breath sounds.  Musculoskeletal:     Cervical back: Neck supple.  Skin:    General: Skin is warm and dry.  Neurological:     Mental Status: She is alert.  Psychiatric:        Mood and Affect: Mood normal.         Behavior: Behavior normal.      UC Treatments / Results  Labs (all labs ordered are listed, but only abnormal results are displayed) Labs Reviewed  POCT RAPID STREP A (OFFICE)    EKG   Radiology No results found.  Procedures Procedures (including critical care time)  Medications Ordered in UC Medications - No data to display  Initial Impression / Assessment and Plan / UC Course  I have reviewed the triage vital signs and the nursing notes.  Pertinent labs & imaging results that were available during my care of the patient were reviewed by me and considered in my medical decision making (see chart for details).    Viral URI, postnasal drip, sore throat.  Rapid strep negative.  Patient declines COVID test.  Discussed symptomatic treatment including Tylenol or ibuprofen, Flonase, Mucinex.  Instructed patient to follow up with her PCP if symptoms are not improving.  She agrees to plan of care.   Final Clinical Impressions(s) / UC Diagnoses   Final diagnoses:  Viral URI  Postnasal drip  Sore throat     Discharge Instructions      Your strep test is negative.    Take Tylenol as needed for fever or discomfort.  Rest and keep yourself hydrated.    Follow-up with your primary care provider if your symptoms are not improving.         ED Prescriptions   None    PDMP not reviewed this encounter.   Sharion Balloon, NP 05/01/22 [id number]    Sharion Balloon, NP 05/01/22 [phone]

## 2022-05-01 NOTE — Discharge Instructions (Addendum)
Your strep test is negative.    Take Tylenol as needed for fever or discomfort.  Rest and keep yourself hydrated.    Follow-up with your primary care provider if your symptoms are not improving.

## 2022-07-11 ENCOUNTER — Ambulatory Visit (INDEPENDENT_AMBULATORY_CARE_PROVIDER_SITE_OTHER): Payer: BC Managed Care – PPO | Admitting: Oncology

## 2022-07-11 ENCOUNTER — Encounter: Payer: Self-pay | Admitting: Oncology

## 2022-07-11 VITALS — BP 112/66 | HR 93 | Temp 98.2°F | Ht 64.0 in | Wt 106.0 lb

## 2022-07-11 DIAGNOSIS — J029 Acute pharyngitis, unspecified: Secondary | ICD-10-CM | POA: Diagnosis not present

## 2022-07-11 LAB — POCT RAPID STREP A (OFFICE): Rapid Strep A Screen: NEGATIVE

## 2022-07-11 MED ORDER — CEFDINIR 300 MG PO CAPS: 300.0000 mg | ORAL_CAPSULE | Freq: Two times a day (BID) | ORAL | 0 refills | Status: AC

## 2022-07-11 NOTE — Progress Notes (Signed)
[facility] [address] [place], [place] [address] Phone: [phone] Fax: [phone]   Office Visit Note  Patient Name: Ruth Smith  Date of Birth:October 09, 2000  Med Rec number [id number]  Date of Service: 07/11/2022  Patient has no known allergies.  Chief Complaint  Patient presents with   Sore Throat    HPI Patient is an 22 y.o. student here for complaints of ST that started yesterday morning and has worsened/spread since. Feels like her neck is swollen. Having trouble swallowing. Boyfriends roommate has been sick recently and has spent a lot of time over there. Has had strep in the past but not recently. No recent antibiotics. No allergies to medications. Has not taken anything otc. Fees like a strep infection.  Graduating this month.  Has never had mono.    Current Medication:  Outpatient Encounter Medications as of 07/11/2022  Medication Sig Note   IUD'S IU 1 each by Intrauterine route once. 07/11/2022: Patient is unsure which IUD she has   LINZESS 145 MCG CAPS capsule Take 145 mcg by mouth daily.    sertraline (ZOLOFT) 100 MG tablet Take 75 mg by mouth daily.    albuterol (VENTOLIN HFA) 108 (90 Base) MCG/ACT inhaler Inhale 2 puffs into the lungs every 4 (four) hours as needed. (Patient not taking: Reported on 07/11/2022)    ibuprofen (ADVIL) 600 MG tablet Take 1 tablet (600 mg total) by mouth every 6 (six) hours as needed. (Patient not taking: Reported on 07/11/2022)    predniSONE (DELTASONE) 20 MG tablet Take 2 tablets (40 mg total) by mouth daily with breakfast. (Patient not taking: Reported on 05/01/2022)    No facility-administered encounter medications on file as of 07/11/2022.      Medical History: Past Medical History:  Diagnosis Date   Anxiety    Depression     Vital Signs: BP 112/66   Pulse 93   Temp 98.2 F (36.8 C) (Tympanic)   Ht 5\' 4"  (1.626 m)   Wt 106 lb (48.1 kg)   SpO2 100%   BMI 18.19 kg/m   ROS: As per HPI.  All other pertinent ROS  negative.     Review of Systems  Constitutional:  Positive for fatigue. Negative for chills, diaphoresis and fever.  HENT:  Positive for postnasal drip, sore throat and trouble swallowing. Negative for congestion, ear pain, sinus pressure and sinus pain.   Respiratory:  Negative for cough.   Gastrointestinal:  Negative for diarrhea, nausea and vomiting.  Musculoskeletal:  Negative for myalgias.  Neurological:  Negative for dizziness and headaches.    Physical Exam Constitutional:      Appearance: She is well-developed.  HENT:     Right Ear: Tympanic membrane normal.     Left Ear: Tympanic membrane normal.     Nose: No congestion or rhinorrhea.     Mouth/Throat:     Lips: Pink.     Mouth: Mucous membranes are moist.     Pharynx: Posterior oropharyngeal erythema present.     Tonsils: Tonsillar exudate present. 3+ on the right. 3+ on the left.     Comments: PND Neurological:     Mental Status: She is alert.     No results found for this or any previous visit (from the past 24 hour(s)).  Assessment/Plan: 1. Sore throat -Strep negative. -Given exam and severity of symptoms, would recommend treating with antibiotics.  Discussed we cannot rule out mono unless we did labs today and recommend treating if symptoms do  not fully resolve or return we can do labs at that point.  Student in agreement. -Finish all antibiotics as prescribed, even when you are feeling better.  TAke antibiotic with food.  Take an over-the-counter pain reliever/anti-inflammatory (such as ibuprofen) to help relieve pain or fever.  Rest and drink plenty of water.  Drinking warm or cold liquids (such as tea, soup or smoothies) and eating soft foods (such as oatmeal) may be more comfortable until your throat pain improves.  Do not share cups/water bottles/ utensils with others.  Wash your hands or use hand sanitizer often, especially before/after eating and after coughing into your hand or blowing your nose.   -Discussed  lab work should symptoms not fully resolve or return after antibiotics to rule out mono. -Send secure message to provider or schedule return appointment as needed for new/worsening symptoms (such as increased throat pain) or if your symptoms are not improving after 2-3 days on the antibiotic.    - POCT rapid strep A - cefdinir (OMNICEF) 300 MG capsule; Take 1 capsule (300 mg total) by mouth 2 (two) times daily.  Dispense: 20 capsule; Refill: 0   General Counseling: [name] verbalizes understanding of the findings of todays visit and agrees with plan of treatment. I have discussed any further diagnostic evaluation that may be needed or ordered today. We also reviewed her medications today. she has been encouraged to call the office with any questions or concerns that should arise related to todays visit.   No orders of the defined types were placed in this encounter.   No orders of the defined types were placed in this encounter.   I spent 20 minutes dedicated to the care of this patient (face-to-face and non-face-to-face) on the date of the encounter to include what is described in the assessment and plan.   Durenda Hurt, NP 07/11/2022 10:38 AM

## 2022-07-12 ENCOUNTER — Ambulatory Visit
Admission: RE | Admit: 2022-07-12 | Discharge: 2022-07-12 | Disposition: A | Discharge status: Home / Self Care | Payer: BC Managed Care – PPO | Source: Ambulatory Visit | Attending: Emergency Medicine | Admitting: Emergency Medicine

## 2022-07-12 VITALS — BP 131/86 | HR 80 | Temp 97.8°F | Resp 16

## 2022-07-12 DIAGNOSIS — J029 Acute pharyngitis, unspecified: Secondary | ICD-10-CM | POA: Diagnosis not present

## 2022-07-12 LAB — POCT RAPID STREP A (OFFICE): Rapid Strep A Screen: NEGATIVE

## 2022-07-12 NOTE — ED Triage Notes (Signed)
Patient presents to UC for sore throat, cough x 2 days. Seen at [facility] student clinic. Prescribed an antibiotic. Mom not comfortable with her starting antibiotic until she gets second opinion. Also requesting strep test.

## 2022-07-12 NOTE — ED Provider Notes (Signed)
Ruth Smith    CSN: [id number] Arrival date & time: 07/12/22  1021      History   Chief Complaint Chief Complaint  Patient presents with   Sore Throat    Entered by patient    HPI Ruth Smith is a 22 y.o. female.   Patient presents for evaluation of a nonproductive cough and sore throat present for 2 days.  Painful to swallow at times but tolerating food and liquids.  Known sick contact prior.  Denies congestion ear pain shortness of breath, wheezing, abdominal symptoms.  History of a tonsillectomy.  Denies fever.  Was evaluated 1 day prior in Seen in student clinic, strep negative at that time, prescribed antibiotic but would like second opinion before starting medication.  Past Medical History:  Diagnosis Date   Anxiety    Depression     Patient Active Problem List   Diagnosis Date Noted   Chronic constipation 09/21/2021   Adolescent idiopathic scoliosis, site unspecified 06/14/2021   Anxiety 06/14/2021    Past Surgical History:  Procedure Laterality Date   TONSILLECTOMY      OB History   No obstetric history on file.      Home Medications    Prior to Admission medications   Medication Sig Start Date End Date Taking? Authorizing Provider  albuterol (VENTOLIN HFA) 108 (90 Base) MCG/ACT inhaler Inhale 2 puffs into the lungs every 4 (four) hours as needed. Patient not taking: Reported on 07/11/2022 02/20/22   [provider]  cefdinir (OMNICEF) 300 MG capsule Take 1 capsule (300 mg total) by mouth 2 (two) times daily. 07/11/22   Mauro Kaufmann, NP  ibuprofen (ADVIL) 600 MG tablet Take 1 tablet (600 mg total) by mouth every 6 (six) hours as needed. Patient not taking: Reported on 07/11/2022 12/20/21   Mickie Bail, NP  IUD'S IU 1 each by Intrauterine route once.    [provider]  LINZESS 145 MCG CAPS capsule Take 145 mcg by mouth daily. 11/24/21   [provider]  predniSONE (DELTASONE) 20 MG tablet Take 2 tablets (40 mg  total) by mouth daily with breakfast. Patient not taking: Reported on 05/01/2022 02/12/22   Mauro Kaufmann, NP  sertraline (ZOLOFT) 100 MG tablet Take 75 mg by mouth daily. 02/09/20   [provider]    Family History History reviewed. No pertinent family history.  Social History Social History   Tobacco Use   Smoking status: Never   Smokeless tobacco: Never  Vaping Use   Vaping Use: Never used  Substance Use Topics   Alcohol use: Yes    Comment: occasional   Drug use: Never     Allergies   Patient has no known allergies.   Review of Systems Review of Systems   Physical Exam Triage Vital Signs ED Triage Vitals [07/12/22 1043]  Enc Vitals Group     BP 131/86     Pulse Rate 80     Resp 16     Temp 97.8 F (36.6 C)     Temp Source Temporal     SpO2 98 %     Weight      Height      Head Circumference      Peak Flow      Pain Score 0     Pain Loc      Pain Edu?      Excl. in GC?    No data found.  Updated Vital Signs BP  131/86 (BP Location: Left Arm)   Pulse 80   Temp 97.8 F (36.6 C) (Temporal)   Resp 16   SpO2 98%   Visual Acuity Right Eye Distance:   Left Eye Distance:   Bilateral Distance:    Right Eye Near:   Left Eye Near:    Bilateral Near:     Physical Exam Constitutional:      Appearance: She is well-developed.  HENT:     Head: Normocephalic.     Right Ear: Tympanic membrane and ear canal normal.     Left Ear: Tympanic membrane and ear canal normal.     Nose: No congestion or rhinorrhea.     Mouth/Throat:     Mouth: Mucous membranes are dry.     Pharynx: No oropharyngeal exudate.     Comments: Mild erythema Cardiovascular:     Rate and Rhythm: Normal rate and regular rhythm.     Heart sounds: Normal heart sounds.  Pulmonary:     Effort: Pulmonary effort is normal.     Breath sounds: Normal breath sounds.  Musculoskeletal:     Cervical back: Normal range of motion and neck supple.  Skin:    General: Skin is warm  and dry.  Neurological:     General: No focal deficit present.     Mental Status: She is alert and oriented to person, place, and time.      UC Treatments / Results  Labs (all labs ordered are listed, but only abnormal results are displayed) Labs Reviewed  POCT RAPID STREP A (OFFICE)    EKG   Radiology No results found.  Procedures Procedures (including critical care time)  Medications Ordered in UC Medications - No data to display  Initial Impression / Assessment and Plan / UC Course  I have reviewed the triage vital signs and the nursing notes.  Pertinent labs & imaging results that were available during my care of the patient were reviewed by me and considered in my medical decision making (see chart for details).  Viral pharyngitis  Mild erythema without exudate present on exam, pharynx is clear without obstruction, no cervical adenopathy present, strep test negative, etiology is most likely viral, discussed this with patient, recommend over-the-counter supportive measures with follow-up as needed, advised that if symptoms persist past 10 days may begin prescribed cefdinir to provide coverage for bacteria which is not present at this time Final Clinical Impressions(s) / UC Diagnoses   Final diagnoses:  Viral pharyngitis     Discharge Instructions      Declined avs   ED Prescriptions   None    PDMP not reviewed this encounter.   Valinda Hoar, NP 07/12/22 1106

## 2022-07-12 NOTE — Discharge Instructions (Addendum)
Declined avs 

## 2023-12-25 IMAGING — CT CT HEAD W/O CM
4 series · 17 of 47 positions shown, 19 images · non-contrast
Comparison: None.

CLINICAL DATA: Recent fall with headaches, initial encounter



[Series 2: head wo · axial · 0.42mm/px · z∈[-91,+24]mm · 7 of 31 slices shown, 9 images]
[im 4/31  brain]
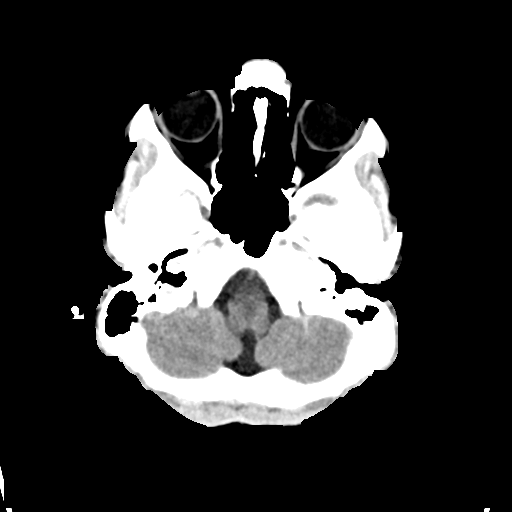
[im 4/31  bone]
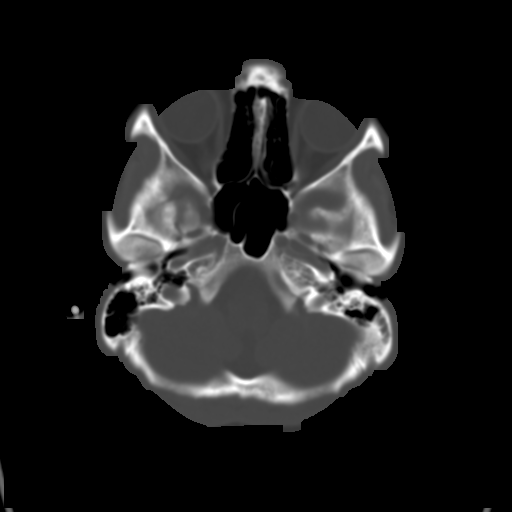
[im 8/31  brain]
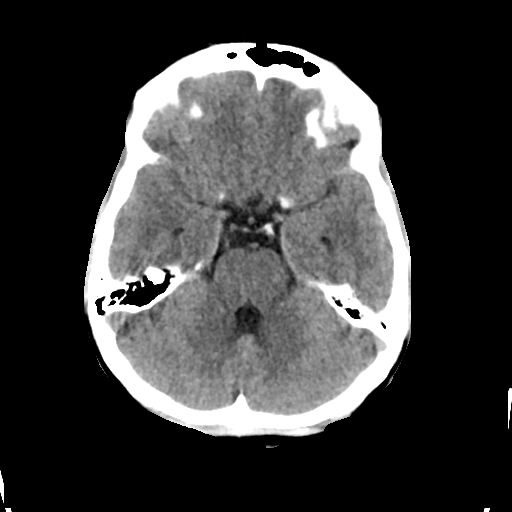
[im 12/31  brain]
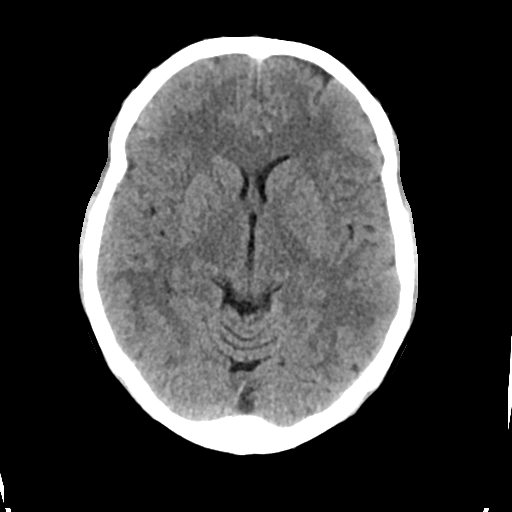
[im 16/31  brain]
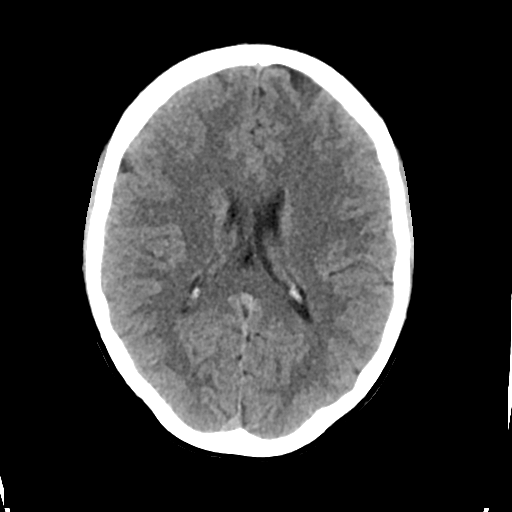
[im 19/31  brain]
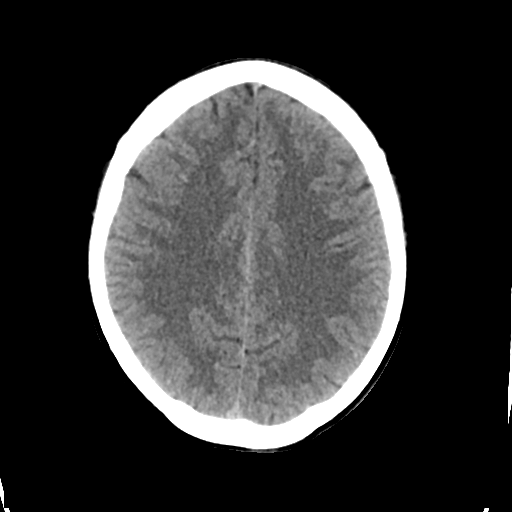
[im 19/31  bone]
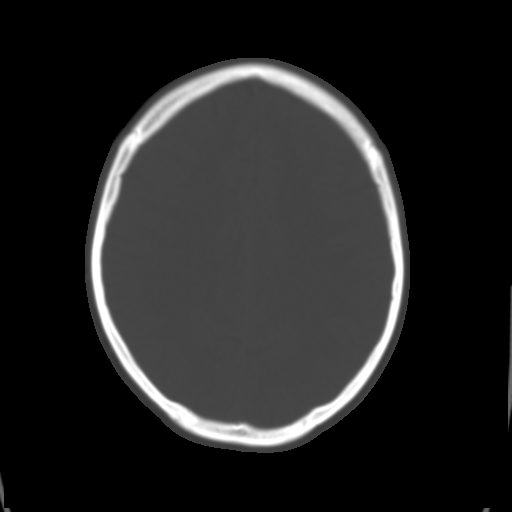
[im 23/31  brain]
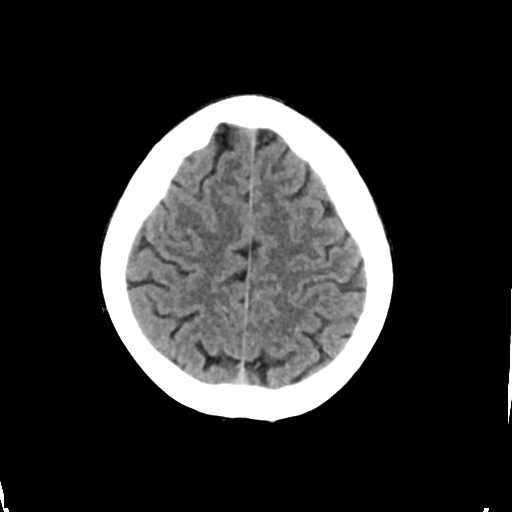
[im 27/31  brain]
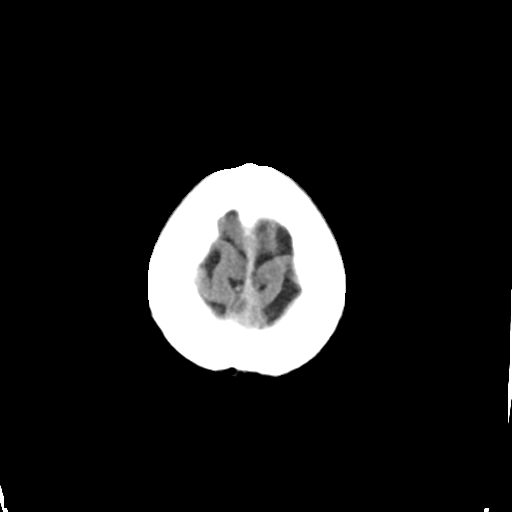

[Series 3: head bone · axial · 0.42mm/px · z∈[-92,-38]mm · 4 of 77 slices shown]
[im 8/77  bone]
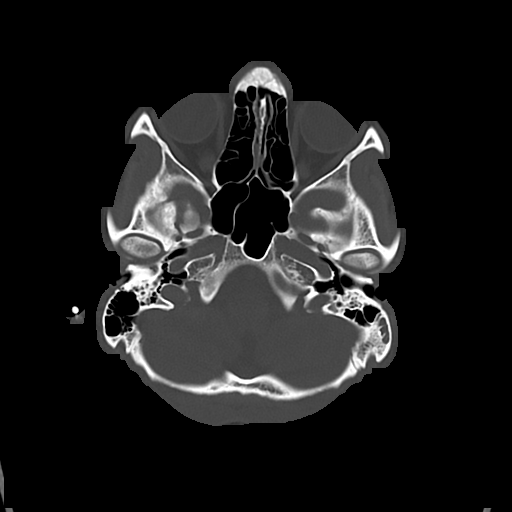
[im 16/77  bone]
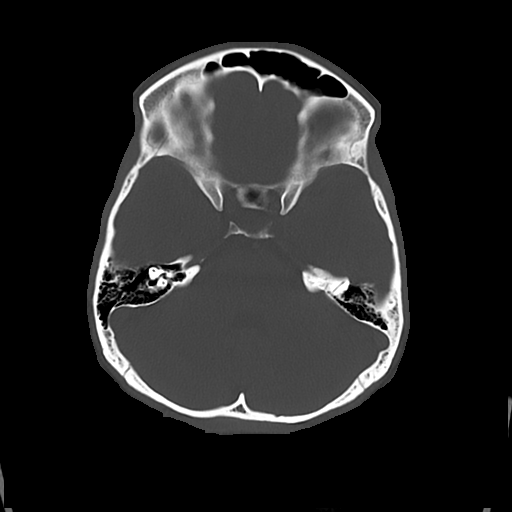
[im 23/77  bone]
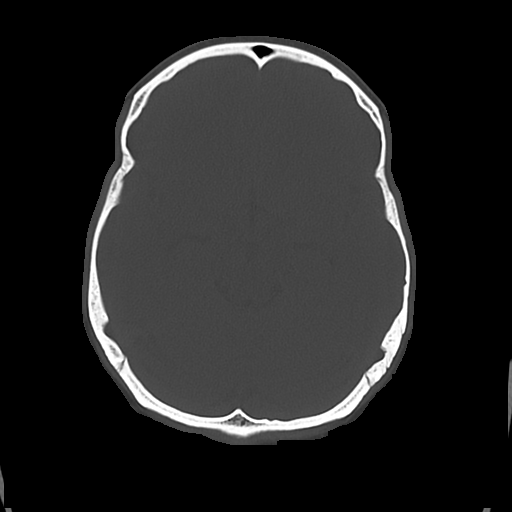
[im 35/77  bone]
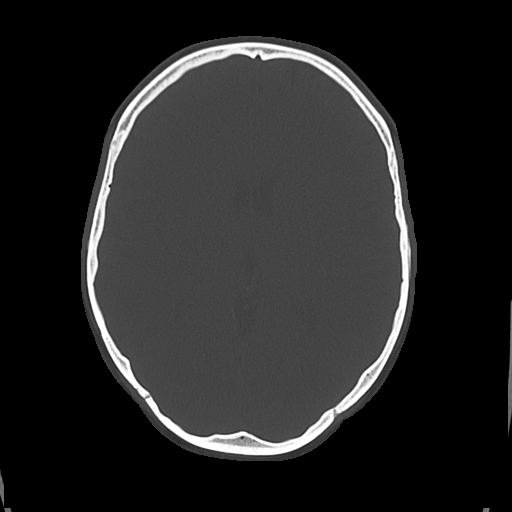

[Series 4: cor soft · coronal · 0.32mm/px · 3 of 62 slices shown]
[im 21/62  brain]
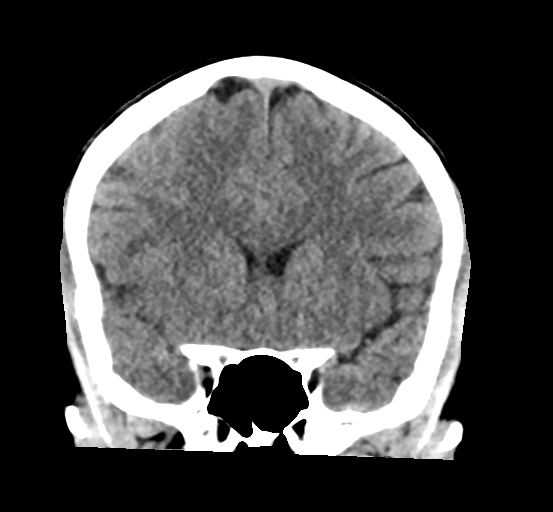
[im 28/62  brain]
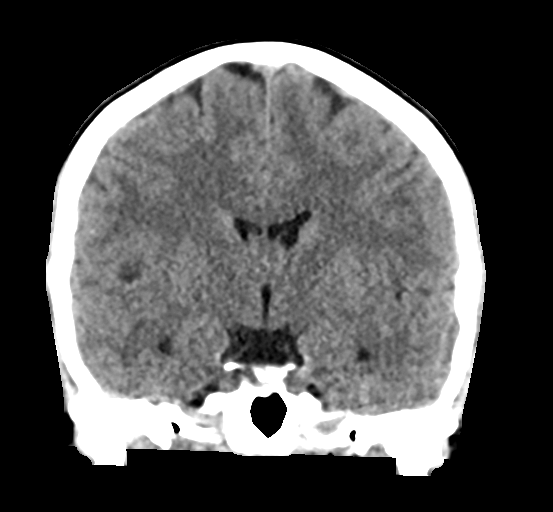
[im 34/62  brain]
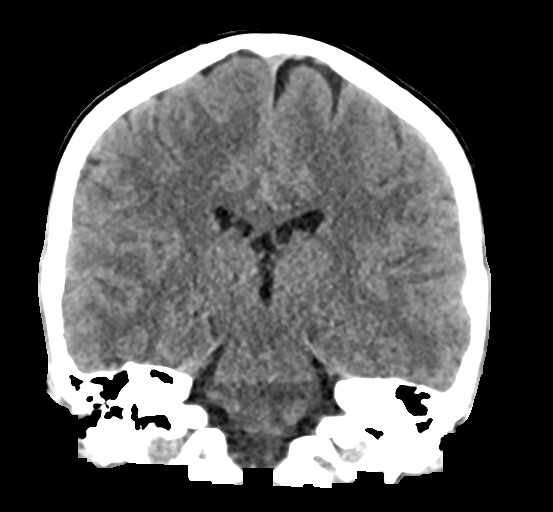

[Series 5: sag soft · sagittal · 0.33mm/px · 3 of 54 slices shown]
[im 18/54  brain]
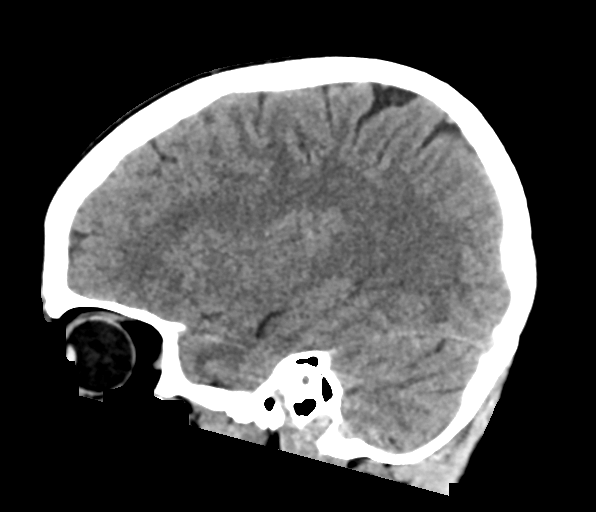
[im 27/54  brain]
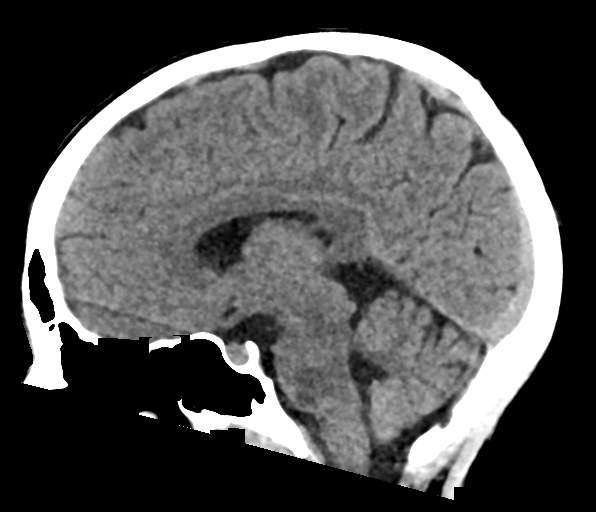
[im 36/54  brain]
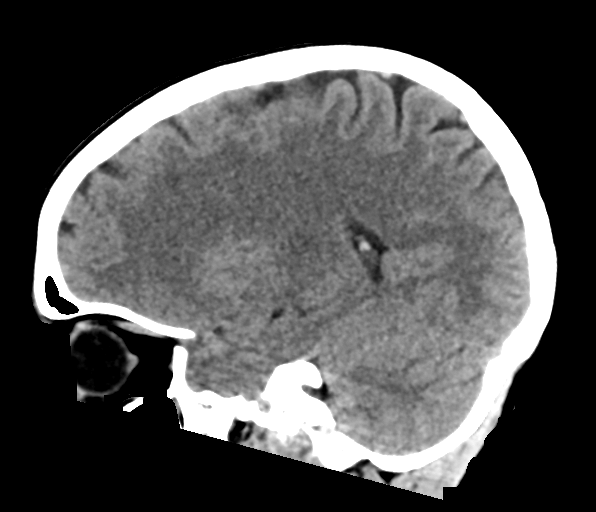

[17 of 47 positions shown; findings below may reference images not displayed]

FINDINGS: Brain: No evidence of acute infarction, hemorrhage, hydrocephalus,
extra-axial collection or mass lesion/mass effect.

Vascular: No hyperdense vessel or unexpected calcification.

Skull: Normal. Negative for fracture or focal lesion.

Sinuses/Orbits: No acute finding.

Other: None.
IMPRESSION: No acute intracranial abnormality noted.
